# Patient Record
Sex: Female | Born: 1935 | Race: White | Hispanic: No | Smoking: Never smoker
Health system: Southern US, Community
[De-identification: ages and names within clinical notes are randomized; demographics above are authoritative.]

## PROBLEM LIST (undated history)

## (undated) DIAGNOSIS — F0390 Unspecified dementia without behavioral disturbance: Secondary | ICD-10-CM

## (undated) DIAGNOSIS — I10 Essential (primary) hypertension: Secondary | ICD-10-CM

---

## 2002-11-03 ENCOUNTER — Encounter: Payer: Self-pay | Admitting: Emergency Medicine

## 2002-11-03 ENCOUNTER — Emergency Department (HOSPITAL_COMMUNITY): Admission: EM | Admit: 2002-11-03 | Discharge: 2002-11-03 | Payer: Self-pay | Admitting: Emergency Medicine

## 2007-02-16 ENCOUNTER — Encounter: Payer: Self-pay | Admitting: Unknown Physician Specialty

## 2007-02-19 ENCOUNTER — Encounter: Payer: Self-pay | Admitting: Unknown Physician Specialty

## 2008-02-27 ENCOUNTER — Emergency Department: Payer: Self-pay | Admitting: Emergency Medicine

## 2008-02-28 ENCOUNTER — Other Ambulatory Visit: Payer: Self-pay

## 2008-03-04 ENCOUNTER — Emergency Department: Payer: Self-pay | Admitting: Emergency Medicine

## 2009-12-22 IMAGING — CT CT ABD-PELV W/O CM
1 of 2 series · 15 of 32 positions shown, 19 images · non-contrast
Comparison: none

REASON FOR EXAM: (1) left mid and left lower quadrant pain without fever
or wbc elevation history
COMMENTS:   LMP: Post-Menopausal

PROCEDURE:     CT  - CT ABDOMEN AND PELVIS W[DATE]  [DATE]
RESULT:     Indication: Renal stone.
TECHNIQUE: A standard renal stone protocol was performed with sequential 5
mm noncontrasted axial images from the lung bases to the symphysis pubis
with the patient in a supine position.

[Series 2: abd/pelvis · axial · 0.68mm/px · z∈[-428,-30]mm · 15 of 145 slices shown, 19 images]
[im 6/145  soft-tissue]
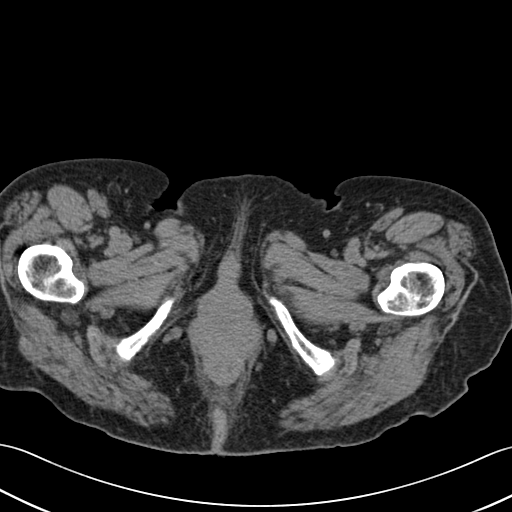
[im 6/145  bone]
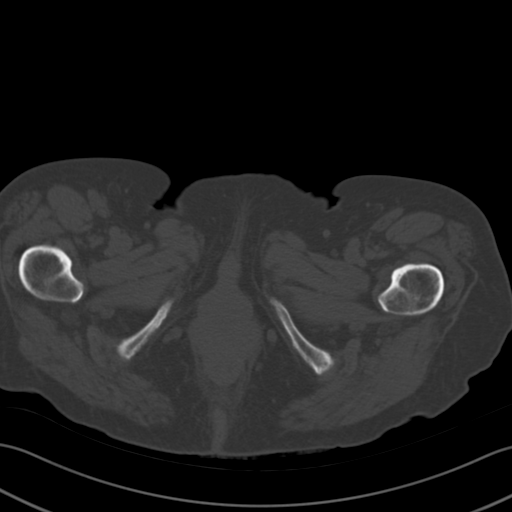
[im 17/145  soft-tissue]
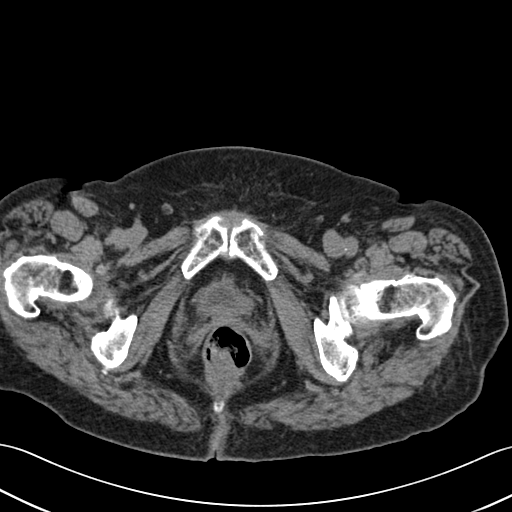
[im 28/145  soft-tissue]
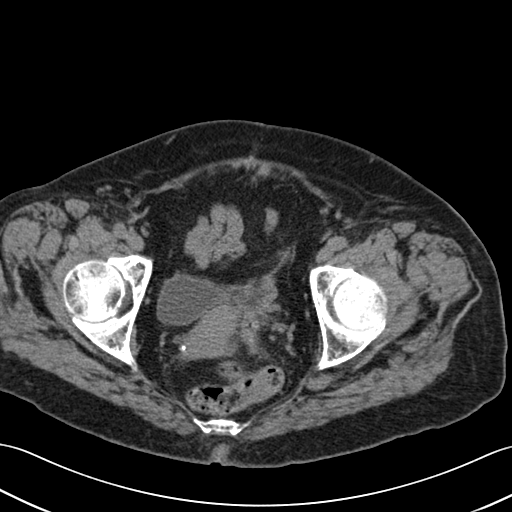
[im 39/145  soft-tissue]
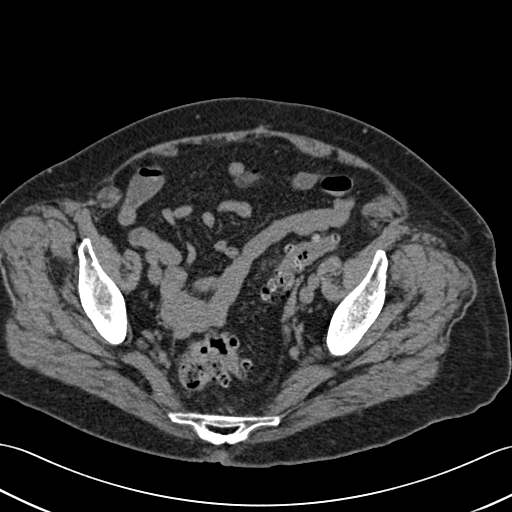
[im 50/145  soft-tissue]
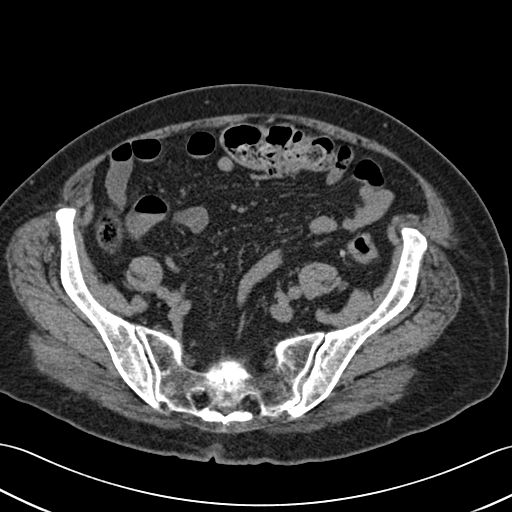
[im 61/145  soft-tissue]
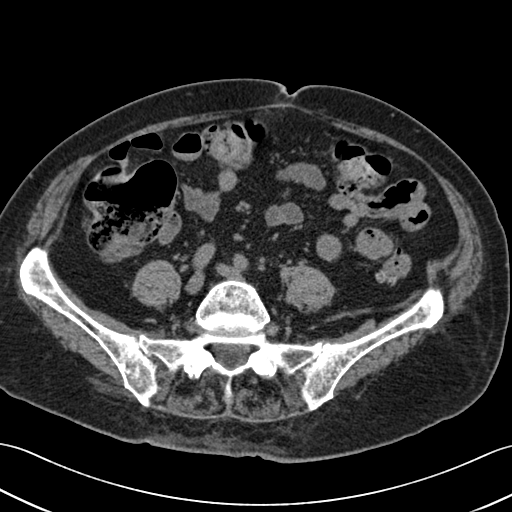
[im 73/145  soft-tissue]
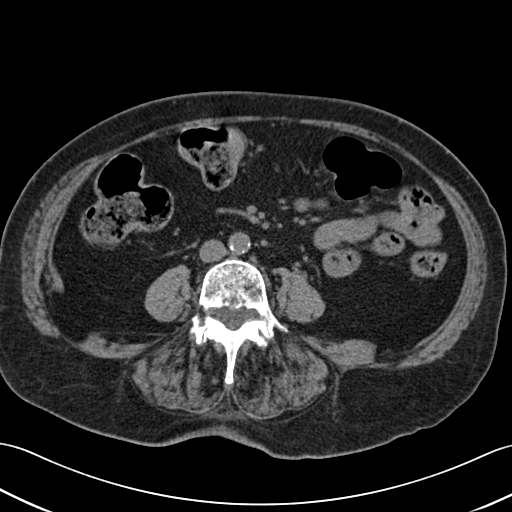
[im 84/145  soft-tissue]
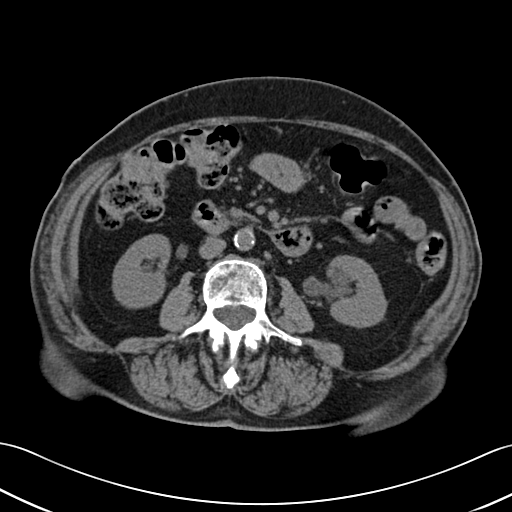
[im 95/145  soft-tissue]
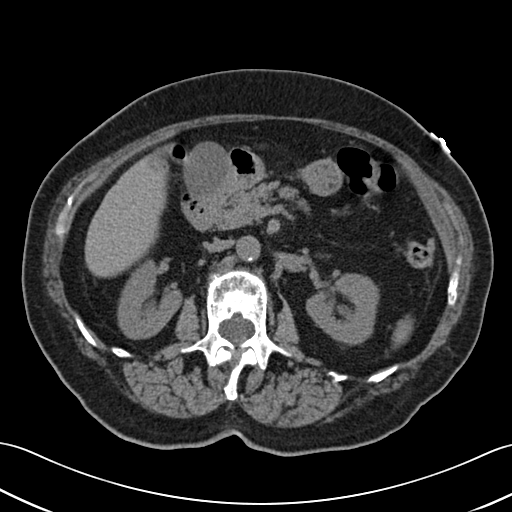
[im 95/145  bone]
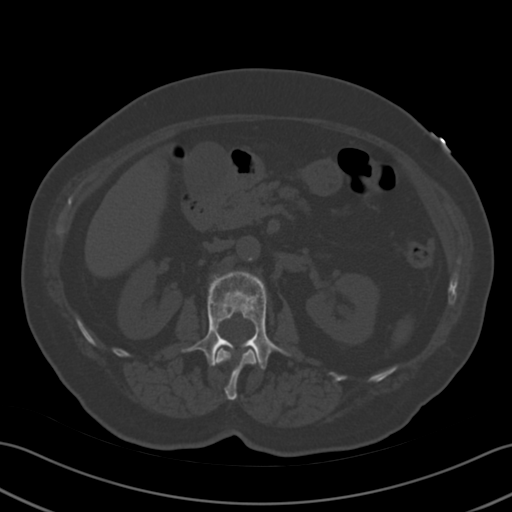
[im 106/145  soft-tissue]
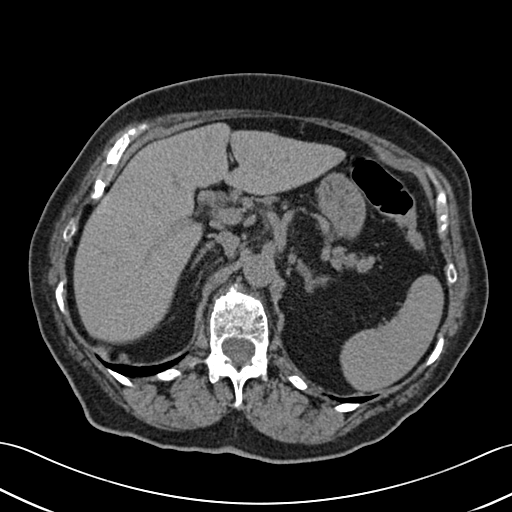
[im 117/145  soft-tissue]
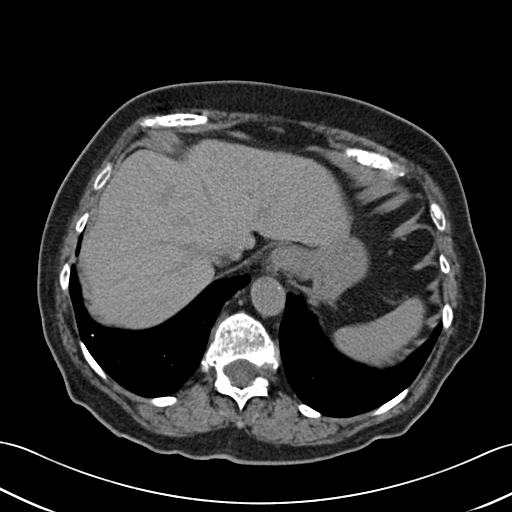
[im 122/145  lung]
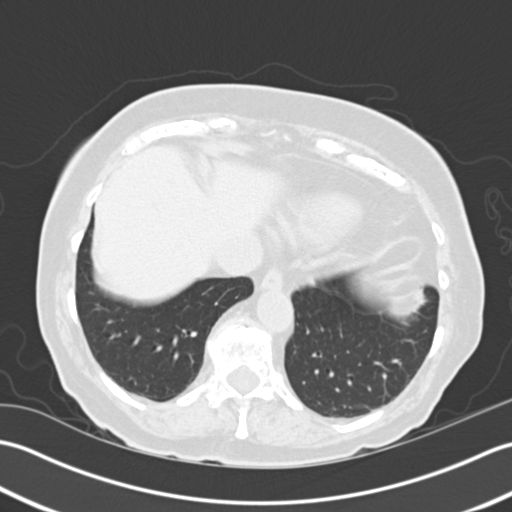
[im 128/145  soft-tissue]
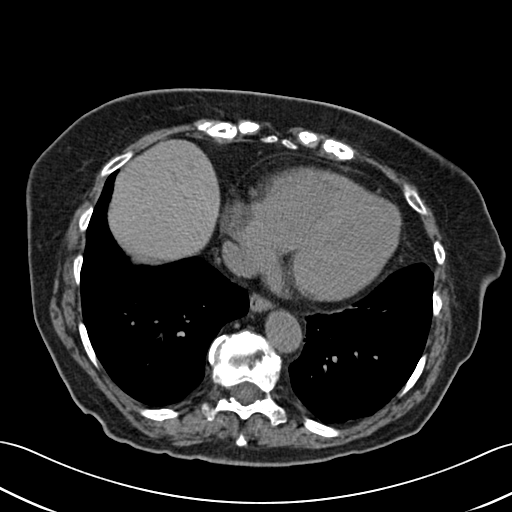
[im 128/145  lung]
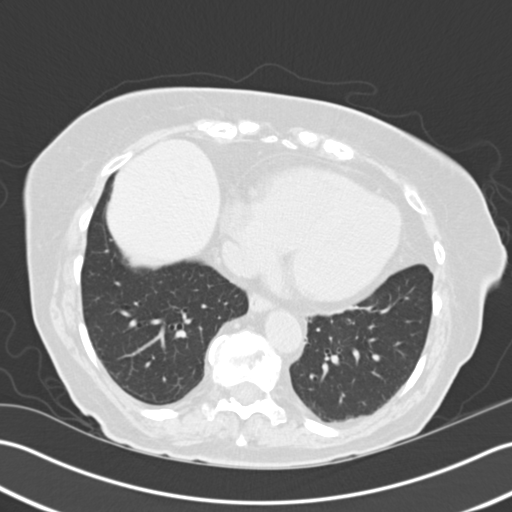
[im 133/145  lung]
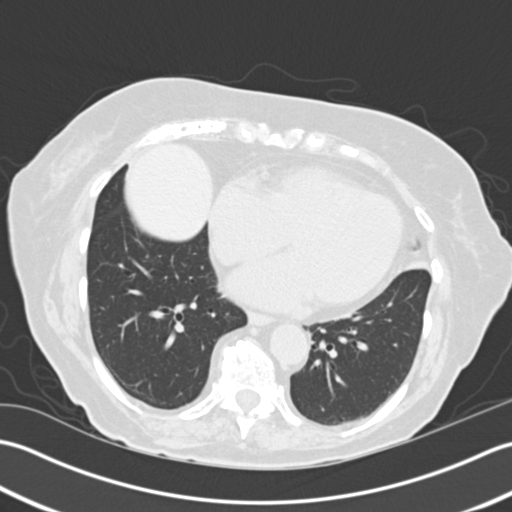
[im 139/145  soft-tissue]
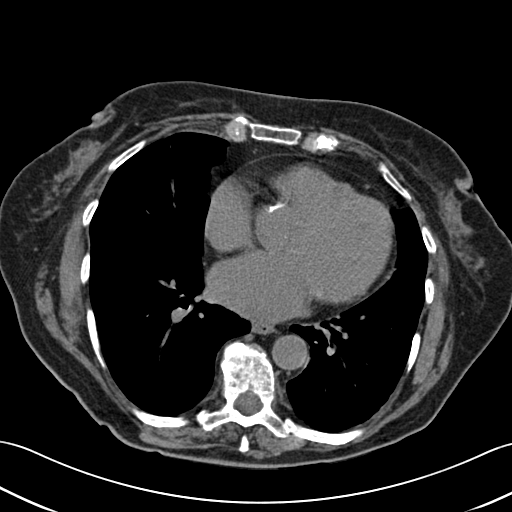
[im 139/145  lung]
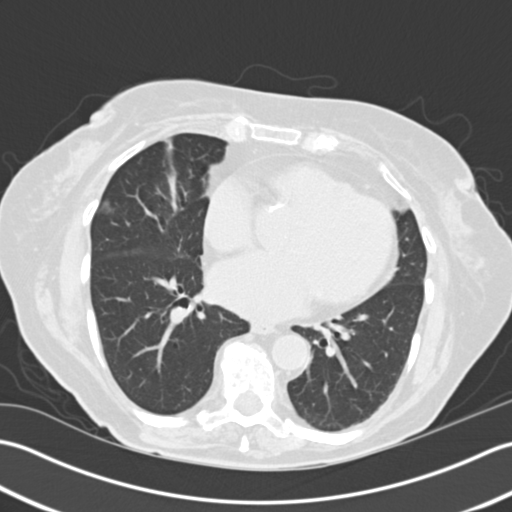

[15 of 32 positions shown; findings below may reference images not displayed]

FINDINGS: Limited views of the lung bases fail to demonstrate abnormal parenchymal
nodules or pleural or pericardial effusions.

No renal, ureteral, or bladder calculi. No obstructive uropathy. There are
no secondary signs of obstruction. No perinephric stranding is seen. The
kidneys are symmetric in size without evidence for exophytic mass. There are
left parapelvic cysts present.

The liver demonstrates no focal abnormality. There is cholelithiasis. The
spleen demonstrates no focal abnormality. The adrenal glands and pancreas
are normal.

The unopacified stomach, duodenum, small intestine, and large intestine are
unremarkable, but evaluation is limited by lack of oral contrast. There is
diverticulosis of the sigmoid colon without evidence of diverticulitis.
There is a small fat containing umbilical hernia. There is no
pneumoperitoneum, pneumatosis, or portal venous gas. There is no abdominal
or pelvic free fluid. There is no lymphadenopathy.

The abdominal aorta is normal in caliber without sclerotic calcification.

There is lumbar spine spondylosis.
IMPRESSION: 1. No urolithiasis or obstructive uropathy.

2. Sigmoid diverticulosis without evidence of diverticulitis.

3. Cholelithiasis.

## 2010-09-14 ENCOUNTER — Emergency Department: Payer: Self-pay | Admitting: Emergency Medicine

## 2013-09-15 ENCOUNTER — Emergency Department: Payer: Self-pay | Admitting: Emergency Medicine

## 2013-09-15 LAB — CBC
HCT: 41.8 % (ref 40.0–52.0)
HGB: 14.1 g/dL (ref 12.0–16.0)
MCH: 31 pg (ref 26.0–34.0)
MCHC: 33.7 g/dL (ref 32.0–36.0)
MCV: 92 fL (ref 80–100)
PLATELETS: 172 10*3/uL (ref 150–440)
RBC: 4.55 10*6/uL (ref 4.40–5.90)
RDW: 13.6 % (ref 11.5–14.5)
WBC: 5.8 10*3/uL (ref 3.8–10.6)

## 2013-09-15 LAB — COMPREHENSIVE METABOLIC PANEL
ALBUMIN: 3.5 g/dL (ref 3.4–5.0)
ALK PHOS: 60 U/L
AST: 22 U/L (ref 15–37)
Anion Gap: 2 — ABNORMAL LOW (ref 7–16)
BUN: 23 mg/dL — ABNORMAL HIGH (ref 7–18)
Bilirubin,Total: 0.4 mg/dL (ref 0.2–1.0)
CHLORIDE: 105 mmol/L (ref 98–107)
CREATININE: 0.85 mg/dL (ref 0.60–1.30)
Calcium, Total: 9.2 mg/dL (ref 8.5–10.1)
Co2: 31 mmol/L (ref 21–32)
EGFR (Non-African Amer.): 56 — ABNORMAL LOW
GLUCOSE: 104 mg/dL — AB (ref 65–99)
OSMOLALITY: 280 (ref 275–301)
Potassium: 3.9 mmol/L (ref 3.5–5.1)
SGPT (ALT): 23 U/L (ref 12–78)
SODIUM: 138 mmol/L (ref 136–145)
Total Protein: 6.9 g/dL (ref 6.4–8.2)

## 2013-09-15 LAB — TROPONIN I

## 2014-12-15 ENCOUNTER — Telehealth: Payer: Self-pay | Admitting: Family Medicine

## 2014-12-15 NOTE — Telephone Encounter (Signed)
Judeth Cornfield called from [place] she is requesting call back at [phone]. She is requesting occupational therapy treatment starting this week 2 times a week for 2 weeks. Please return her call.

## 2014-12-15 NOTE — Telephone Encounter (Signed)
Here's a request. 

## 2014-12-16 ENCOUNTER — Emergency Department
Admission: EM | Admit: 2014-12-16 | Discharge: 2014-12-16 | Disposition: A | Discharge status: Home / Self Care | Payer: Medicare PPO | Attending: Emergency Medicine | Admitting: Emergency Medicine

## 2014-12-16 ENCOUNTER — Encounter: Payer: Self-pay | Admitting: *Deleted

## 2014-12-16 DIAGNOSIS — W1839XA Other fall on same level, initial encounter: Secondary | ICD-10-CM | POA: Insufficient documentation

## 2014-12-16 DIAGNOSIS — I10 Essential (primary) hypertension: Secondary | ICD-10-CM | POA: Insufficient documentation

## 2014-12-16 DIAGNOSIS — S0990XA Unspecified injury of head, initial encounter: Secondary | ICD-10-CM | POA: Diagnosis not present

## 2014-12-16 DIAGNOSIS — S0083XA Contusion of other part of head, initial encounter: Secondary | ICD-10-CM | POA: Diagnosis not present

## 2014-12-16 DIAGNOSIS — Y9289 Other specified places as the place of occurrence of the external cause: Secondary | ICD-10-CM | POA: Insufficient documentation

## 2014-12-16 DIAGNOSIS — Y9389 Activity, other specified: Secondary | ICD-10-CM | POA: Diagnosis not present

## 2014-12-16 DIAGNOSIS — F0390 Unspecified dementia without behavioral disturbance: Secondary | ICD-10-CM | POA: Diagnosis not present

## 2014-12-16 DIAGNOSIS — Y998 Other external cause status: Secondary | ICD-10-CM | POA: Diagnosis not present

## 2014-12-16 DIAGNOSIS — S0003XA Contusion of scalp, initial encounter: Secondary | ICD-10-CM | POA: Insufficient documentation

## 2014-12-16 DIAGNOSIS — T148 Other injury of unspecified body region: Secondary | ICD-10-CM | POA: Insufficient documentation

## 2014-12-16 HISTORY — DX: Unspecified dementia, unspecified severity, without behavioral disturbance, psychotic disturbance, mood disturbance, and anxiety: F03.90

## 2014-12-16 HISTORY — DX: Essential (primary) hypertension: I10

## 2014-12-16 LAB — URINALYSIS COMPLETE WITH MICROSCOPIC (ARMC ONLY)
Bacteria, UA: NONE SEEN
Bilirubin Urine: NEGATIVE
Glucose, UA: NEGATIVE mg/dL
HGB URINE DIPSTICK: NEGATIVE
Leukocytes, UA: NEGATIVE
NITRITE: NEGATIVE
PROTEIN: NEGATIVE mg/dL
SPECIFIC GRAVITY, URINE: 1.02 (ref 1.005–1.030)
pH: 5 (ref 5.0–8.0)

## 2014-12-16 NOTE — ED Notes (Signed)
Transport arranged with [facility].

## 2014-12-16 NOTE — ED Notes (Signed)
When I called to give report to [facility], I spoke with Victorino DikeJennifer, RN who advised that the patient has been having increased confusion, flank pain, and agitation.  They were in the process of obtaining an urine specimen to check for UTI when patient fell.  Informed Dr. Mayford KnifeWilliams who asked the patient's son if he would like a work up for this and he told her yes.

## 2014-12-16 NOTE — ED Notes (Signed)
Aundra DubinUpdated Jennifer, RN on plan to send urinalysis results to [facility].   [facility] Fax #: [phone] att jennifer

## 2014-12-16 NOTE — ED Notes (Signed)
Resident of [facility], hx freq falls out of her wheelchir, has varying stages of bruises on her face, today she scooted to the front of her wheelchair and falls out

## 2014-12-16 NOTE — Discharge Instructions (Signed)
Head Injury  You have a head injury. Headaches and throwing up (vomiting) are common after a head injury. It should be easy to wake up from sleeping. Sometimes you must stay in the hospital. Most problems happen within the first 24 hours. Side effects may occur up to 7-10 days after the injury.   WHAT ARE THE TYPES OF HEAD INJURIES?  Head injuries can be as minor as a bump. Some head injuries can be more severe. More severe head injuries include:  · A jarring injury to the brain (concussion).  · A bruise of the brain (contusion). This mean there is bleeding in the brain that can cause swelling.  · A cracked skull (skull fracture).  · Bleeding in the brain that collects, clots, and forms a bump (hematoma).  WHEN SHOULD I GET HELP RIGHT AWAY?   · You are confused or sleepy.  · You cannot be woken up.  · You feel sick to your stomach (nauseous) or keep throwing up (vomiting).  · Your dizziness or unsteadiness is getting worse.  · You have very bad, lasting headaches that are not helped by medicine. Take medicines only as told by your doctor.  · You cannot use your arms or legs like normal.  · You cannot walk.  · You notice changes in the black spots in the center of the colored part of your eye (pupil).  · You have clear or bloody fluid coming from your nose or ears.  · You have trouble seeing.  During the next 24 hours after the injury, you must stay with someone who can watch you. This person should get help right away (call 911 in the [place] if you start to shake and are not able to control it (have seizures), you pass out, or you are unable to wake up.  HOW CAN I PREVENT A HEAD INJURY IN THE FUTURE?  · Wear seat belts.  · Wear a helmet while bike riding and playing sports like football.  · Stay away from dangerous activities around the house.  WHEN CAN I RETURN TO NORMAL ACTIVITIES AND ATHLETICS?  See your doctor before doing these activities. You should not do normal activities or play contact sports until 1 week  after the following symptoms have stopped:  · Headache that does not go away.  · Dizziness.  · Poor attention.  · Confusion.  · Memory problems.  · Sickness to your stomach or throwing up.  · Tiredness.  · Fussiness.  · Bothered by bright lights or loud noises.  · Anxiousness or depression.  · Restless sleep.  MAKE SURE YOU:   · Understand these instructions.  · Will watch your condition.  · Will get help right away if you are not doing well or get worse.  Document Released: 05/19/2008 Document Revised: 10/21/2013 Document Reviewed: 02/11/2013  [employer]® Patient Information ©2015 [employer]. This information is not intended to replace advice given to you by your health care provider. Make sure you discuss any questions you have with your health care provider.
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
## 2014-12-16 NOTE — ED Notes (Signed)
Joy informed to [facility], fax number provided.

## 2014-12-16 NOTE — ED Notes (Signed)
[facility]ident of [facility]  Scoots out of wheelchair and falls forward

## 2014-12-17 NOTE — Telephone Encounter (Signed)
Annette CorollaStephanie Rice is returning your call. [phone]

## 2014-12-17 NOTE — Telephone Encounter (Signed)
Spoke with [facility]phanie [facility] and she is requesting occupational therapy for patient to prevent her recurrent falls from the wheelchair. She will use a wheelchair with a seat at an angle and use a wedge shaped cushion. Confirmed and ordered occupational therapy for 2 weeks.

## 2015-10-19 DEATH — deceased
